# Patient Record
Sex: Male | Born: 1990 | Hispanic: Yes | Marital: Single | Smoking: Current some day smoker
Health system: Southern US, Community
[De-identification: ages and names within clinical notes are randomized; demographics above are authoritative.]

---
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
## 2011-05-12 ENCOUNTER — Emergency Department (HOSPITAL_COMMUNITY): Payer: Self-pay

## 2011-05-12 ENCOUNTER — Encounter (HOSPITAL_COMMUNITY): Payer: Self-pay | Admitting: *Deleted

## 2011-05-12 ENCOUNTER — Emergency Department (HOSPITAL_COMMUNITY)
Admission: EM | Admit: 2011-05-12 | Discharge: 2011-05-12 | Disposition: A | Discharge status: Home / Self Care | Payer: Self-pay | Attending: Emergency Medicine | Admitting: Emergency Medicine

## 2011-05-12 DIAGNOSIS — R079 Chest pain, unspecified: Secondary | ICD-10-CM | POA: Insufficient documentation

## 2011-05-12 DIAGNOSIS — J069 Acute upper respiratory infection, unspecified: Secondary | ICD-10-CM | POA: Insufficient documentation

## 2011-05-12 DIAGNOSIS — F17200 Nicotine dependence, unspecified, uncomplicated: Secondary | ICD-10-CM | POA: Insufficient documentation

## 2011-05-12 DIAGNOSIS — R059 Cough, unspecified: Secondary | ICD-10-CM | POA: Insufficient documentation

## 2011-05-12 DIAGNOSIS — R0602 Shortness of breath: Secondary | ICD-10-CM | POA: Insufficient documentation

## 2011-05-12 DIAGNOSIS — J3489 Other specified disorders of nose and nasal sinuses: Secondary | ICD-10-CM | POA: Insufficient documentation

## 2011-05-12 DIAGNOSIS — R05 Cough: Secondary | ICD-10-CM | POA: Insufficient documentation

## 2011-05-12 MED ORDER — ALBUTEROL SULFATE HFA 108 (90 BASE) MCG/ACT IN AERS
2.0000 | INHALATION_SPRAY | Freq: Four times a day (QID) | RESPIRATORY_TRACT | Status: DC
  Filled 2011-05-12: qty 6.7

## 2011-05-12 MED ORDER — PROMETHAZINE-DM 6.25-15 MG/5ML PO SYRP: 5.0000 mL | ORAL_SOLUTION | Freq: Four times a day (QID) | ORAL | Status: AC | PRN

## 2011-05-12 MED ORDER — ALBUTEROL SULFATE (5 MG/ML) 0.5% IN NEBU
5.0000 mg | INHALATION_SOLUTION | Freq: Once | RESPIRATORY_TRACT | Status: AC
  Administered 2011-05-12: 5 mg via RESPIRATORY_TRACT
  Filled 2011-05-12: qty 1

## 2011-05-12 MED ORDER — PREDNISONE 50 MG PO TABS: 50.0000 mg | ORAL_TABLET | Freq: Every day | ORAL | Status: AC

## 2011-05-12 MED ORDER — GUAIFENESIN ER 1200 MG PO TB12: 1.0000 | ORAL_TABLET | Freq: Two times a day (BID) | ORAL | Status: DC

## 2011-05-12 MED ORDER — METHYLPREDNISOLONE SODIUM SUCC 125 MG IJ SOLR: 125.0000 mg | Freq: Once | INTRAMUSCULAR | Status: DC

## 2011-05-12 NOTE — ED Provider Notes (Signed)
History     CSN: [id number]  Arrival date & time 05/12/11  4098   First MD Initiated Contact with Patient 05/12/11 [phone]      Chief Complaint  Patient presents with  . Shortness of Breath    (Consider location/radiation/quality/duration/timing/severity/associated sxs/prior treatment) Patient is a 21 y.o. male presenting with shortness of breath. The history is provided by the patient.  Shortness of Breath  The current episode started today. The problem has been gradually worsening. The problem is moderate. The symptoms are relieved by nothing. The symptoms are aggravated by activity. Associated symptoms include chest pain, rhinorrhea, cough and shortness of breath. The cough is non-productive. The cough is relieved by rest. The rhinorrhea has been occurring frequently. The nasal discharge has a clear appearance. He has had no prior steroid use. His past medical history does not include asthma.    History reviewed. No pertinent past medical history.  History reviewed. No pertinent past surgical history.  History reviewed. No pertinent family history.  History  Substance Use Topics  . Smoking status: Current Some Day Smoker    Types: Cigarettes  . Smokeless tobacco: Not on file  . Alcohol Use: No      Review of Systems  HENT: Positive for congestion and rhinorrhea.   Respiratory: Positive for cough and shortness of breath.   Cardiovascular: Positive for chest pain.  Musculoskeletal: Negative for myalgias.  Neurological: Negative for dizziness.    Allergies  Review of patient's allergies indicates no known allergies.  Home Medications  No current outpatient prescriptions on file.  There were no vitals taken for this visit.  Physical Exam  Constitutional: He is oriented to person, place, and time. He appears well-developed and well-nourished.  HENT:  Head: Normocephalic.  Nose: Rhinorrhea present.  Eyes: Pupils are equal, round, and reactive to light.  Neck:  Normal range of motion.  Cardiovascular: Normal rate.   Pulmonary/Chest: No respiratory distress. He has no wheezes. He exhibits tenderness.  Abdominal: Soft.  Musculoskeletal: Normal range of motion.  Neurological: He is alert and oriented to person, place, and time.  Skin: Skin is warm and dry.  Psychiatric: He has a normal mood and affect.    ED Course  Procedures (including critical care time)  Labs Reviewed - No data to display No results found.   No diagnosis found.  This has this patient has had an upper respiratory infection for the past 2 weeks with occasional nonproductive cough.  No having pain in his lower chest with deep inspiration  .  Improved  after neb moving more  air Awaiting xray   MDM  URI         Arman Filter, NP 05/12/11 4782  Arman Filter, NP 05/12/11 [phone]

## 2011-05-12 NOTE — ED Notes (Signed)
Pt reports he can take a deep breathe easier but it still hurts with very deep inspiration.

## 2011-05-12 NOTE — ED Provider Notes (Signed)
Medical screening examination/treatment/procedure(s) were performed by non-physician practitioner and as supervising physician I was immediately available for consultation/collaboration.   Catrice Zuleta, MD 05/12/11 0710 

## 2011-05-12 NOTE — ED Notes (Signed)
PT reports congestion and chest pain worsening when he takes a deep breathe. Pt reports he took two aspirins for the pain before he came in. Pt wheezing and cannot take a deep breathe due to pain.

## 2011-05-12 NOTE — ED Notes (Signed)
Pt reports waking up from sleep with CP and SOB.  States that he has had a cold, congestion, runny nose x 3 weeks.  Pt was diagnosed with the flu x 1 month ago.  BS clear. Skin warm, dry and intact.

## 2012-01-12 ENCOUNTER — Other Ambulatory Visit (HOSPITAL_COMMUNITY): Payer: Self-pay | Admitting: Otolaryngology

## 2012-01-26 NOTE — Progress Notes (Signed)
I spoke with patient in the am and he asked me to call back after 3pm, he was at work.  I made several calls to his home and mobile number, no answer.  I called his moble and left instructions.

## 2012-01-27 ENCOUNTER — Ambulatory Visit (HOSPITAL_COMMUNITY): Admission: RE | Admit: 2012-01-27 | Payer: Self-pay | Source: Ambulatory Visit | Admitting: Otolaryngology

## 2012-01-27 ENCOUNTER — Encounter (HOSPITAL_COMMUNITY): Payer: Self-pay | Admitting: Anesthesiology

## 2012-01-27 ENCOUNTER — Encounter (HOSPITAL_COMMUNITY): Admission: RE | Payer: Self-pay | Source: Ambulatory Visit

## 2012-01-27 SURGERY — EXCISION, CYST, EAR
Anesthesia: General | Laterality: Left

## 2012-01-27 NOTE — Anesthesia Preprocedure Evaluation (Deleted)
Anesthesia Evaluation  Patient identified by MRN, date of birth, ID band Patient awake    Reviewed: Allergy & Precautions, H&P , NPO status , Patient's Chart, lab work & pertinent test results, reviewed documented beta blocker date and time   Airway Mallampati: II TM Distance: >3 FB Neck ROM: full    Dental   Pulmonary neg pulmonary ROS,  breath sounds clear to auscultation        Cardiovascular negative cardio ROS  Rhythm:regular     Neuro/Psych negative neurological ROS  negative psych ROS   GI/Hepatic negative GI ROS, Neg liver ROS,   Endo/Other  negative endocrine ROS  Renal/GU negative Renal ROS  negative genitourinary   Musculoskeletal   Abdominal   Peds  Hematology negative hematology ROS (+)   Anesthesia Other Findings See surgeon's H&P   Reproductive/Obstetrics negative OB ROS                           Anesthesia Physical Anesthesia Plan  ASA: I  Anesthesia Plan: General   Post-op Pain Management:    Induction: Intravenous  Airway Management Planned: Oral ETT  Additional Equipment:   Intra-op Plan:   Post-operative Plan: Extubation in OR  Informed Consent: I have reviewed the patients History and Physical, chart, labs and discussed the procedure including the risks, benefits and alternatives for the proposed anesthesia with the patient or authorized representative who has indicated his/her understanding and acceptance.   Dental Advisory Given  Plan Discussed with: CRNA and Surgeon  Anesthesia Plan Comments:         Anesthesia Quick Evaluation  

## 2013-07-14 IMAGING — CR DG CHEST 2V
2 series · 2 of 2 positions shown · non-contrast
Comparison: None.

CLINICAL DATA: Mid chest pain, shortness of breath and runny nose.

CHEST - 2 VIEW

[w chest pa]
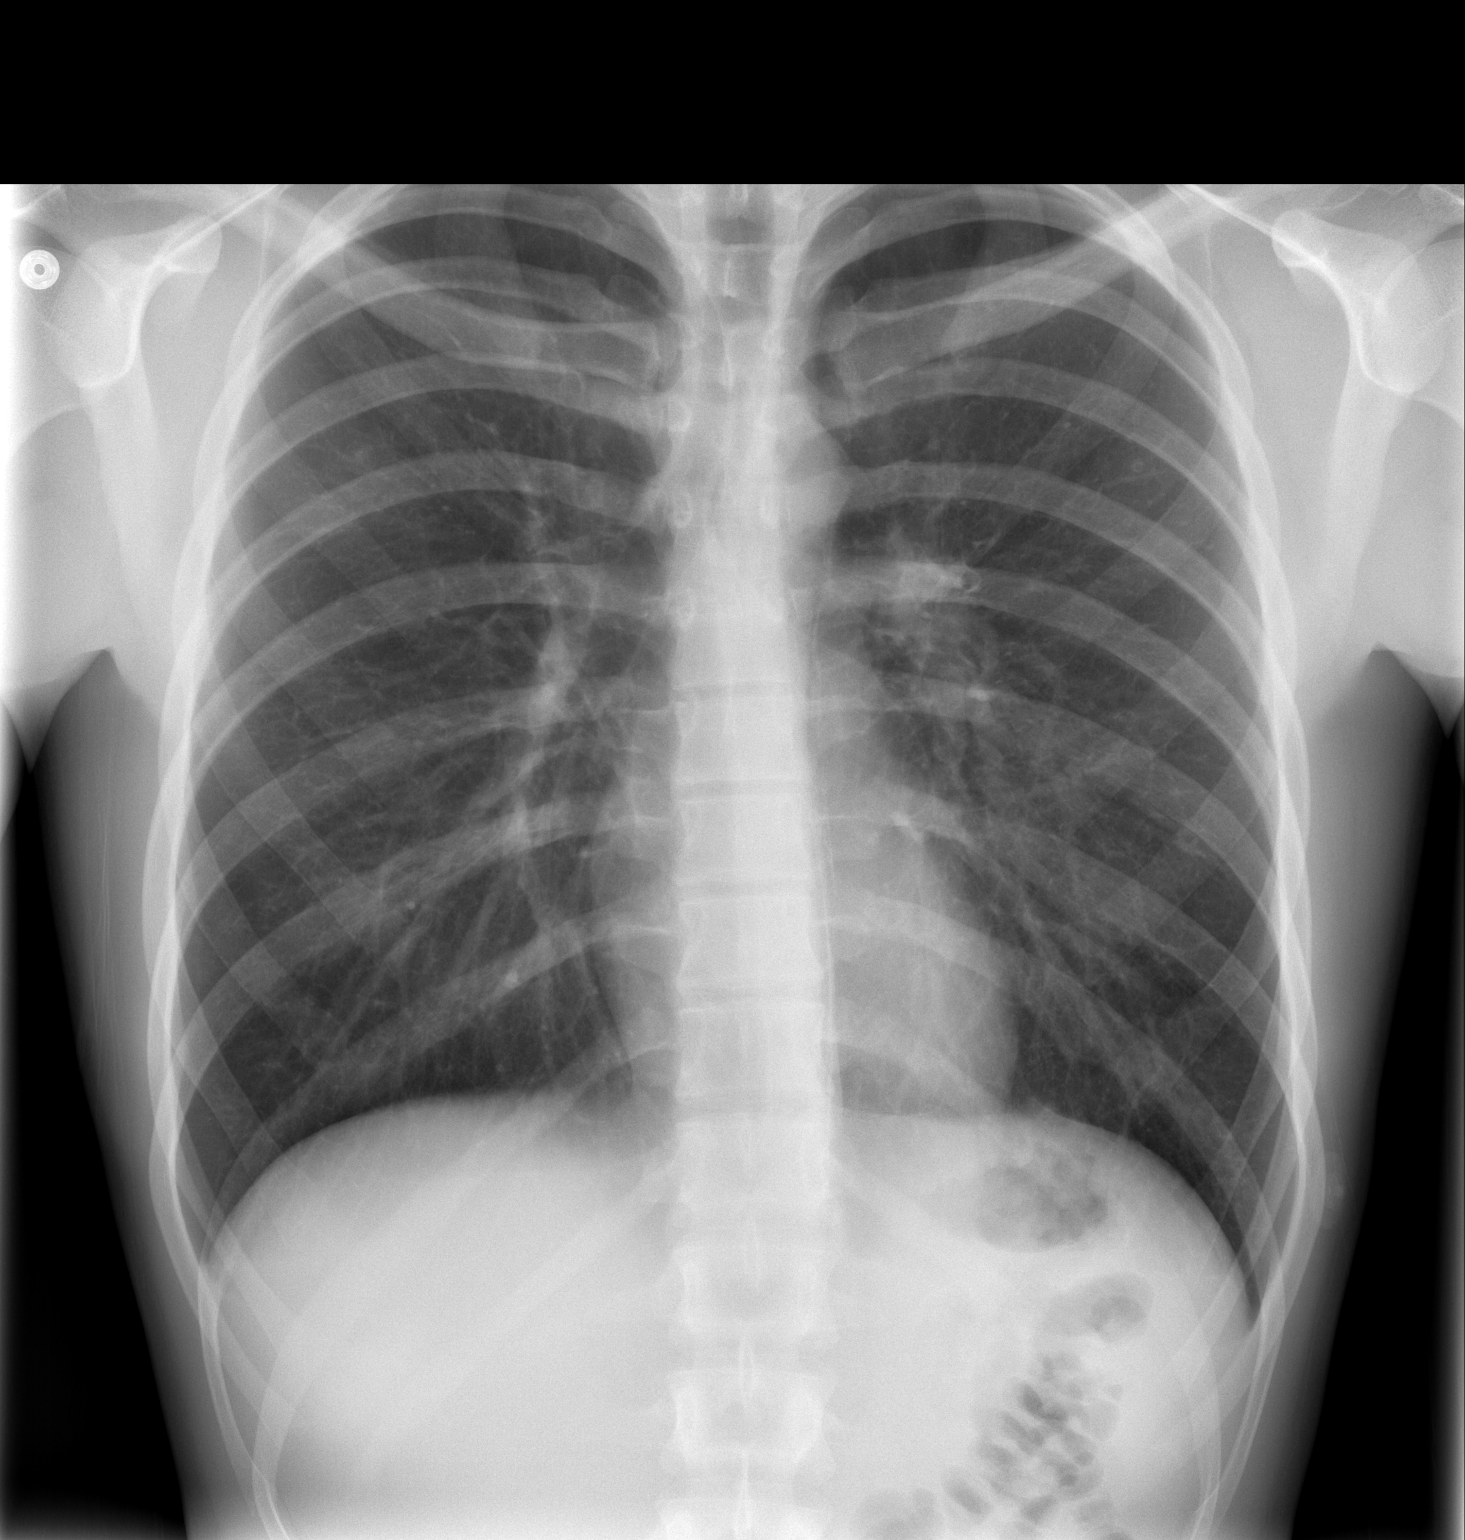

[w chest lat]
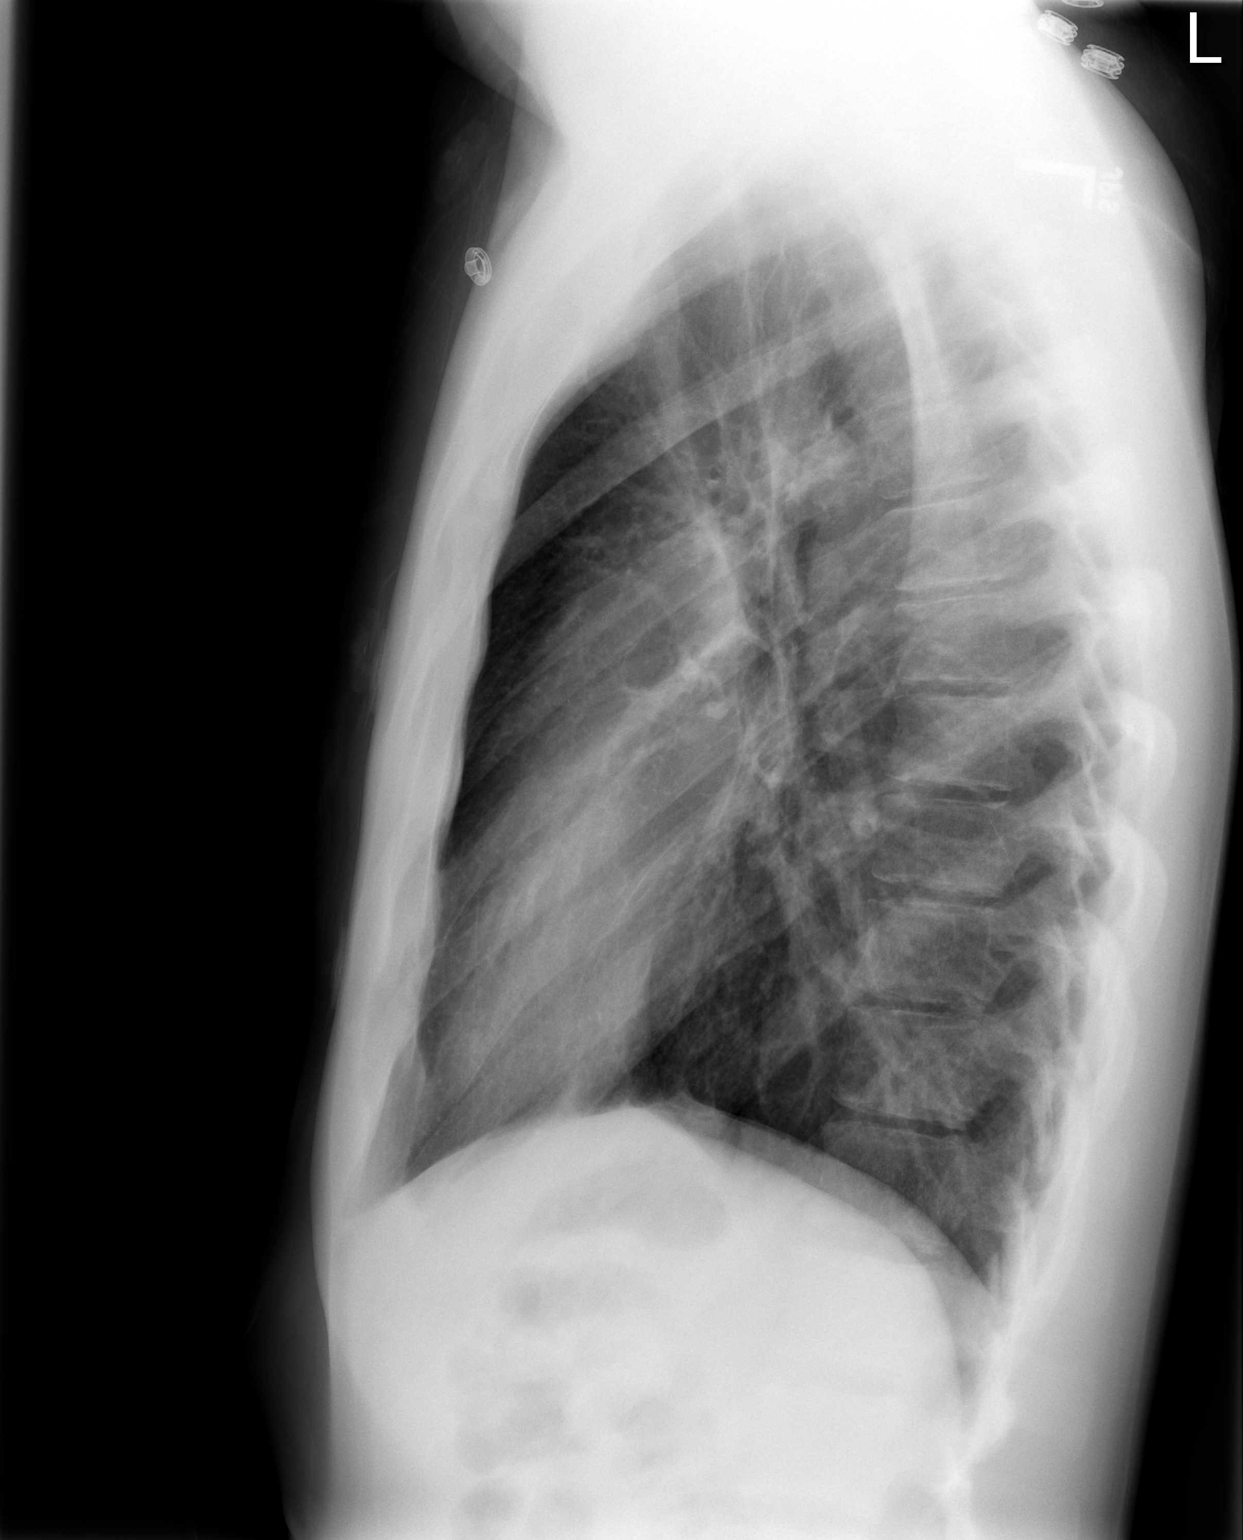

[2 of 2 positions shown; findings below may reference images not displayed]

FINDINGS: The lungs are well-aerated and clear.  There is no
evidence of focal opacification, pleural effusion or pneumothorax.

The heart is normal in size; the mediastinal contour is within
normal limits.  No acute osseous abnormalities are seen.
IMPRESSION: No acute cardiopulmonary process seen.

## 2016-04-28 ENCOUNTER — Encounter (HOSPITAL_COMMUNITY): Payer: Self-pay | Admitting: Emergency Medicine

## 2016-04-28 ENCOUNTER — Ambulatory Visit (HOSPITAL_COMMUNITY)
Admission: EM | Admit: 2016-04-28 | Discharge: 2016-04-28 | Disposition: A | Discharge status: Home / Self Care | Payer: Self-pay | Attending: Emergency Medicine | Admitting: Emergency Medicine

## 2016-04-28 DIAGNOSIS — S61312A Laceration without foreign body of right middle finger with damage to nail, initial encounter: Secondary | ICD-10-CM

## 2016-04-28 DIAGNOSIS — Z23 Encounter for immunization: Secondary | ICD-10-CM

## 2016-04-28 MED ORDER — TETANUS-DIPHTH-ACELL PERTUSSIS 5-2.5-18.5 LF-MCG/0.5 IM SUSP
0.5000 mL | Freq: Once | INTRAMUSCULAR | Status: AC
  Administered 2016-04-28: 0.5 mL via INTRAMUSCULAR

## 2016-04-28 MED ORDER — BACITRACIN ZINC 500 UNIT/GM EX OINT
TOPICAL_OINTMENT | CUTANEOUS | Status: AC
  Filled 2016-04-28: qty 0.9

## 2016-04-28 MED ORDER — BUPIVACAINE-EPINEPHRINE (PF) 0.5% -1:200000 IJ SOLN
INTRAMUSCULAR | Status: AC
  Filled 2016-04-28: qty 1.8

## 2016-04-28 MED ORDER — BUPIVACAINE HCL (PF) 0.5 % IJ SOLN
INTRAMUSCULAR | Status: AC
Start: 2016-04-28 — End: 2016-04-28
  Filled 2016-04-28: qty 10

## 2016-04-28 MED ORDER — CEPHALEXIN 500 MG PO CAPS: 500.0000 mg | ORAL_CAPSULE | Freq: Four times a day (QID) | ORAL | 0 refills | Status: AC

## 2016-04-28 MED ORDER — BACITRACIN ZINC 500 UNIT/GM EX OINT
TOPICAL_OINTMENT | Freq: Once | CUTANEOUS | Status: AC
  Administered 2016-04-28: 1 via TOPICAL

## 2016-04-28 MED ORDER — HYDROCODONE-ACETAMINOPHEN 5-325 MG PO TABS: 1.0000 | ORAL_TABLET | ORAL | 0 refills | Status: AC | PRN

## 2016-04-28 MED ORDER — TETANUS-DIPHTH-ACELL PERTUSSIS 5-2.5-18.5 LF-MCG/0.5 IM SUSP
INTRAMUSCULAR | Status: AC
Start: 2016-04-28 — End: 2016-04-28
  Filled 2016-04-28: qty 0.5

## 2016-04-28 NOTE — ED Triage Notes (Signed)
Here for lac to left middle finger onset 1030.... 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 NAD

## 2016-04-28 NOTE — ED Provider Notes (Signed)
CSN: [id number]     Arrival date & time 04/28/16  1319 History   First MD Initiated Contact with Patient 04/28/16 1337     Chief Complaint  Patient presents with  . Laceration   (Consider location/radiation/quality/duration/timing/severity/associated sxs/prior Treatment) 50 are O male was working with a table saw accidentally cut through the distal phalanx of the left middle finger. This occurred approximate 10 AM today. This produced a jagged laceration and skin avulsion across the distal phalanx including a portion of the nail and a more superficial epidermal laceration across the DIP. His last tetanus was 10 or more years ago.      History reviewed. No pertinent past medical history. History reviewed. No pertinent surgical history. History reviewed. No pertinent family history. Social History  Substance Use Topics  . Smoking status: Current Some Day Smoker    Types: Cigarettes  . Smokeless tobacco: Never Used  . Alcohol use Yes    Review of Systems  Constitutional: Negative.   Respiratory: Negative.   Skin: Positive for wound.       Laceration to distal left finger as per history of present illness.  Neurological: Negative.   Hematological: Negative.   All other systems reviewed and are negative.   Allergies  Patient has no known allergies.  Home Medications   Prior to Admission medications   Medication Sig Start Date End Date Taking? Authorizing Provider  cephALEXin (KEFLEX) 500 MG capsule Take 1 capsule (500 mg total) by mouth 4 (four) times daily. 04/28/16   Hayden Rasmussen, NP  HYDROcodone-acetaminophen (NORCO/VICODIN) 5-325 MG tablet Take 1 tablet by mouth every 4 (four) hours as needed. 04/28/16   Hayden Rasmussen, NP   Meds Ordered and Administered this Visit   Medications  bacitracin ointment (not administered)  Tdap (BOOSTRIX) injection 0.5 mL (0.5 mLs Intramuscular Given 04/28/16 1458)    BP 137/84 (BP Location: Left Arm)   Pulse 90   Temp 98 F (36.7 C) (Oral)    Resp 16   SpO2 98%  No data found.   Physical Exam  Constitutional: He is oriented to person, place, and time. He appears well-developed and well-nourished. No distress.  Cardiovascular: Normal rate.   Pulmonary/Chest: Effort normal.  Musculoskeletal: He exhibits no edema or deformity.  Left distal finger with normal sensation. He has normal flexion and extension of the PIP and DIP against resistance. There are fractures across the radial side of the nail. Portion of the dermis adjacent to the nail is missing due to the injury. The laceration across the DIP surface is superficial. Does not extend beyond the dermis. No foreign bodies are seen.  Neurological: He is alert and oriented to person, place, and time.  Skin: Skin is warm and dry.  Psychiatric: He has a normal mood and affect.  Nursing note and vitals reviewed.   Urgent Care Course   Clinical Course     .Marland KitchenLaceration Repair Date/Time: 04/28/2016 2:20 PM Performed by: Phineas Real, Stephenson Cichy Authorized by: Domenick Gong   Consent:    Consent obtained:  Verbal   Consent given by:  Patient   Risks discussed:  Infection, pain and need for additional repair Anesthesia (see MAR for exact dosages):    Anesthesia method:  Nerve block   Block anesthetic:  Bupivacaine 0.25% w/o epi and lidocaine 2% w/o epi   Block technique:  Digital block   Block injection procedure:  Introduced needle, anatomic landmarks identified and negative aspiration for blood   Block outcome:  Anesthesia achieved Laceration  details:    Location:  Finger   Finger location:  R long finger   Length (cm):  2.8 Repair type:    Repair type:  Intermediate Pre-procedure details:    Preparation:  Patient was prepped and draped in usual sterile fashion Exploration:    Hemostasis achieved with:  Direct pressure   Wound exploration: wound explored through full range of motion and entire depth of wound probed and visualized     Wound extent: no fascia violation noted, no  foreign bodies/material noted, no muscle damage noted, no nerve damage noted, no tendon damage noted and no vascular damage noted     Contaminated: no   Treatment:    Area cleansed with:  Betadine and saline   Amount of cleaning:  Extensive   Irrigation solution:  Sterile saline   Irrigation method:  Syringe   Visualized foreign bodies/material removed: no   Skin repair:    Repair method:  Sutures   Suture size:  4-0   Suture material:  Prolene   Suture technique:  Simple interrupted   Number of sutures:  7 Approximation:    Approximation:  Close Post-procedure details:    Dressing:  Antibiotic ointment, splint for protection and sterile dressing   Patient tolerance of procedure:  Tolerated with difficulty Comments:     The laceration varies in depth from less than 1 mm to approximately 3 mm closer to the nail. 3-0 nylon was used for the more distal sutures and 5-0 Prolene was used for the more superficial depth of the proximal aspect of the laceration. Wound was scrubbed and irrigated with a copious amount of saline as well as Betadine prep. No foreign bodies were seen. Small portion of the wound could not be completely closed due to loss of dermis.   (including critical care time)  Labs Review Labs Reviewed - No data to display  Imaging Review No results found.   Visual Acuity Review  Right Eye Distance:   Left Eye Distance:   Bilateral Distance:    Right Eye Near:   Left Eye Near:    Bilateral Near:         MDM   1. Laceration of right middle finger without foreign body with damage to nail, initial encounter   Follow written instructions on care of laceration. Apply Neosporin ointment for a total of 2 more days only. Keep the splint on until you return in approximately 9 days for suture removal. Watch for signs of infection. If you have increased pain, redness, swelling, drainage of pus return immediately. Try to keep the finger from bending so the sutures will not  rip through the laceration. Meds ordered this encounter  Medications  . Tdap (BOOSTRIX) injection 0.5 mL  . bacitracin ointment  . HYDROcodone-acetaminophen (NORCO/VICODIN) 5-325 MG tablet    Sig: Take 1 tablet by mouth every 4 (four) hours as needed.    Dispense:  12 tablet    Refill:  0    Order Specific Question:   Supervising Provider    Answer:   Domenick GongMORTENSON, ASHLEY [[id number]]  . cephALEXin (KEFLEX) 500 MG capsule    Sig: Take 1 capsule (500 mg total) by mouth 4 (four) times daily.    Dispense:  16 capsule    Refill:  0    Order Specific Question:   Supervising Provider    Answer:   Domenick GongMORTENSON, ASHLEY [[id number]]        Hayden Rasmussenavid Lanessa Shill, NP 04/28/16 1500

## 2016-04-28 NOTE — Discharge Instructions (Signed)
Follow written instructions on care of laceration. Apply Neosporin ointment for a total of 2 more days only. Keep the splint on until you return in approximately 9 days for suture removal. Watch for signs of infection. If you have increased pain, redness, swelling, drainage of pus return immediately. Try to keep the finger from bending so the sutures will not rip through the laceration.

## 2019-04-25 ENCOUNTER — Ambulatory Visit: Payer: Self-pay | Attending: Internal Medicine

## 2019-04-25 ENCOUNTER — Other Ambulatory Visit: Payer: Self-pay

## 2019-04-25 DIAGNOSIS — U071 COVID-19: Secondary | ICD-10-CM | POA: Insufficient documentation

## 2019-04-25 DIAGNOSIS — Z20822 Contact with and (suspected) exposure to covid-19: Secondary | ICD-10-CM

## 2019-04-26 LAB — NOVEL CORONAVIRUS, NAA: SARS-CoV-2, NAA: DETECTED — AB
